# Patient Record
Sex: Male | Born: 1980 | Race: Black or African American | Hispanic: No | Marital: Single | Smoking: Never smoker
Health system: Southern US, Community
[De-identification: ages and names within clinical notes are randomized; demographics above are authoritative.]

## PROBLEM LIST (undated history)

## (undated) ENCOUNTER — Ambulatory Visit (HOSPITAL_COMMUNITY): Admission: EM | Payer: Self-pay | Source: Home / Self Care

---

## 2017-04-21 ENCOUNTER — Encounter (HOSPITAL_COMMUNITY): Payer: Self-pay | Admitting: Emergency Medicine

## 2017-04-21 ENCOUNTER — Ambulatory Visit (HOSPITAL_COMMUNITY)
Admission: EM | Admit: 2017-04-21 | Discharge: 2017-04-21 | Disposition: A | Discharge status: Home / Self Care | Payer: Self-pay | Attending: Family Medicine | Admitting: Family Medicine

## 2017-04-21 DIAGNOSIS — J0110 Acute frontal sinusitis, unspecified: Secondary | ICD-10-CM

## 2017-04-21 MED ORDER — AMOXICILLIN-POT CLAVULANATE 875-125 MG PO TABS: 1.0000 | ORAL_TABLET | Freq: Two times a day (BID) | ORAL | 0 refills | Status: AC

## 2017-04-21 NOTE — Discharge Instructions (Signed)
I'm treating you for acute sinusitis. I prescribed a medicine called Augmentin, take one tablet twice a day for 7 days. I also recommend Flonase, 2 sprays each nostril once daily. If your symptoms persist, or fail to resolve, return to clinic, or follow-up with primary care provider in one week.

## 2017-04-21 NOTE — ED Triage Notes (Signed)
PT reports sore throat, nasal pain, facial pain, and painful sneezing for 3 week.s

## 2017-04-21 NOTE — ED Provider Notes (Signed)
CSN: [id number]     Arrival date & time 04/21/17  1047 History   First MD Initiated Contact with Patient 04/21/17 1153     Chief Complaint  Patient presents with  . Sore Throat  . Facial Pain   (Consider location/radiation/quality/duration/timing/severity/associated sxs/prior Treatment) 36 year old male presents to clinic for evaluation of sinus pain and pressure ongoing for [redacted] weeks along with fatigue, sore throat, ear pressure, and swollen lymph nodes. Sinus pressure is limited to the right frontal sinuses, is clear discharge with some blood streaking. He denies any fever, loss of appetite, nausea, vomiting, diarrhea, or other symptoms. He has not tried any over-the-counter medicines, does not smoke, and denies history of allergies. No recent history of URI   The history is provided by the patient.    History reviewed. No pertinent past medical history. History reviewed. No pertinent surgical history. No family history on file. Social History  Substance Use Topics  . Smoking status: Never Smoker  . Smokeless tobacco: Never Used  . Alcohol use No    Review of Systems  Constitutional: Negative for appetite change, chills and fatigue.  HENT: Positive for congestion, ear pain, rhinorrhea, sinus pain and sinus pressure. Negative for ear discharge, facial swelling, sneezing, trouble swallowing and voice change.   Eyes: Negative.   Respiratory: Negative.   Cardiovascular: Negative.   Gastrointestinal: Negative.   Genitourinary: Negative.   Musculoskeletal: Negative.   Skin: Negative.   Neurological: Negative.     Allergies  Patient has no known allergies.  Home Medications   Prior to Admission medications   Medication Sig Start Date End Date Taking? Authorizing Provider  amoxicillin-clavulanate (AUGMENTIN) 875-125 MG tablet Take 1 tablet by mouth 2 (two) times daily. 04/21/17   Dorena Bodo, NP   Meds Ordered and Administered this Visit  Medications - No data to  display  BP 132/86 (BP Location: Left Arm)   Pulse 60   Temp 98.6 F (37 C) (Oral)   Resp 16   Ht  (1.854 m)   Wt 260 lb (117.9 kg)   SpO2 98%   BMI 34.30 kg/m  No data found.   Physical Exam  Constitutional: He is oriented to person, place, and time. He appears well-developed and well-nourished. No distress.  HENT:  Head: Normocephalic and atraumatic.  Right Ear: Tympanic membrane and external ear normal.  Left Ear: Tympanic membrane and external ear normal.  Nose: Mucosal edema and rhinorrhea present. Right sinus exhibits maxillary sinus tenderness. Right sinus exhibits no frontal sinus tenderness. Left sinus exhibits no maxillary sinus tenderness and no frontal sinus tenderness.  Eyes: Conjunctivae are normal. Right eye exhibits no discharge. Left eye exhibits no discharge.  Neck: Normal range of motion. Neck supple.  Cardiovascular: Normal rate and regular rhythm.   Pulmonary/Chest: Effort normal.  Lymphadenopathy:    He has no cervical adenopathy.  Neurological: He is alert and oriented to person, place, and time.  Skin: Skin is warm and dry. Capillary refill takes less than 2 seconds. He is not diaphoretic.  Psychiatric: He has a normal mood and affect. His behavior is normal.  Nursing note and vitals reviewed.   Urgent Care Course     Procedures (including critical care time)  Labs Review Labs Reviewed - No data to display  Imaging Review No results found.     MDM   1. Acute non-recurrent frontal sinusitis     Treating for acute sinusitis, counseling provided on over-the-counter therapies for symptom management. Recommend hydration, rest,  return to clinic in one week for follow-up with primary care symptoms persist.     Dorena Bodo, NP 04/21/17 1211

## 2018-02-03 ENCOUNTER — Other Ambulatory Visit: Payer: Self-pay

## 2018-02-03 ENCOUNTER — Emergency Department (HOSPITAL_COMMUNITY)
Admission: EM | Admit: 2018-02-03 | Discharge: 2018-02-03 | Disposition: A | Discharge status: Home / Self Care | Payer: Self-pay | Attending: Emergency Medicine | Admitting: Emergency Medicine

## 2018-02-03 ENCOUNTER — Encounter (HOSPITAL_COMMUNITY): Payer: Self-pay | Admitting: *Deleted

## 2018-02-03 ENCOUNTER — Emergency Department (HOSPITAL_COMMUNITY): Payer: Self-pay

## 2018-02-03 DIAGNOSIS — J111 Influenza due to unidentified influenza virus with other respiratory manifestations: Secondary | ICD-10-CM | POA: Insufficient documentation

## 2018-02-03 DIAGNOSIS — Z79899 Other long term (current) drug therapy: Secondary | ICD-10-CM | POA: Insufficient documentation

## 2018-02-03 DIAGNOSIS — R69 Illness, unspecified: Secondary | ICD-10-CM

## 2018-02-03 LAB — COMPREHENSIVE METABOLIC PANEL
ALBUMIN: 3.7 g/dL (ref 3.5–5.0)
ALK PHOS: 55 U/L (ref 38–126)
ALT: 30 U/L (ref 17–63)
ANION GAP: 14 (ref 5–15)
AST: 30 U/L (ref 15–41)
BILIRUBIN TOTAL: 0.5 mg/dL (ref 0.3–1.2)
BUN: 12 mg/dL (ref 6–20)
CALCIUM: 9.1 mg/dL (ref 8.9–10.3)
CO2: 25 mmol/L (ref 22–32)
Chloride: 98 mmol/L — ABNORMAL LOW (ref 101–111)
Creatinine, Ser: 1.2 mg/dL (ref 0.61–1.24)
GLUCOSE: 111 mg/dL — AB (ref 65–99)
POTASSIUM: 3.4 mmol/L — AB (ref 3.5–5.1)
Sodium: 137 mmol/L (ref 135–145)
TOTAL PROTEIN: 7.8 g/dL (ref 6.5–8.1)

## 2018-02-03 LAB — CBC WITH DIFFERENTIAL/PLATELET
BASOS PCT: 0 %
Basophils Absolute: 0 10*3/uL (ref 0.0–0.1)
Eosinophils Absolute: 0 10*3/uL (ref 0.0–0.7)
Eosinophils Relative: 0 %
HEMATOCRIT: 44.2 % (ref 39.0–52.0)
Hemoglobin: 14.8 g/dL (ref 13.0–17.0)
LYMPHS PCT: 34 %
Lymphs Abs: 2.5 10*3/uL (ref 0.7–4.0)
MCH: 29 pg (ref 26.0–34.0)
MCHC: 33.5 g/dL (ref 30.0–36.0)
MCV: 86.7 fL (ref 78.0–100.0)
MONO ABS: 1 10*3/uL (ref 0.1–1.0)
Monocytes Relative: 14 %
NEUTROS ABS: 3.9 10*3/uL (ref 1.7–7.7)
NEUTROS PCT: 52 %
Platelets: 318 10*3/uL (ref 150–400)
RBC: 5.1 MIL/uL (ref 4.22–5.81)
RDW: 13.7 % (ref 11.5–15.5)
WBC: 7.5 10*3/uL (ref 4.0–10.5)

## 2018-02-03 LAB — RAPID STREP SCREEN (MED CTR MEBANE ONLY): Streptococcus, Group A Screen (Direct): NEGATIVE

## 2018-02-03 MED ORDER — SODIUM CHLORIDE 0.9 % IV BOLUS (SEPSIS)
1000.0000 mL | Freq: Once | INTRAVENOUS | Status: AC
  Administered 2018-02-03: 1000 mL via INTRAVENOUS

## 2018-02-03 NOTE — Discharge Instructions (Signed)
Please take Ibuprofen (Advil, motrin) and Tylenol (acetaminophen) to relieve your pain.  You may take up to 600 MG (3 pills) of normal strength ibuprofen every 8 hours as needed.  In between doses of ibuprofen you make take tylenol, up to 1,000 mg (two extra strength pills).  Do not take more than 3,000 mg tylenol in a 24 hour period.  Please check all medication labels as many medications such as pain and cold medications may contain tylenol.  Do not drink alcohol while taking these medications.  Do not take other NSAID'S while taking ibuprofen (such as aleve or naproxen).  Please take ibuprofen with food to decrease stomach upset.  Today your electrolytes were normal.  Please make sure you stay well hydrated.  If you have any concerns or develop new symptoms then please seek additional medical evaluation.

## 2018-02-03 NOTE — ED Notes (Signed)
ED Provider at bedside. 

## 2018-02-03 NOTE — ED Triage Notes (Signed)
States Fri didn't feel well after eating drink some alcohol fri night had a handover on sat. States she has had fever and chills with "tremorms in his face". C/o generalized bodyaches not sleeping , states he has been taking otc flu medication last several days.

## 2018-02-03 NOTE — ED Provider Notes (Signed)
[facility] EMERGENCY DEPARTMENT Provider Note   CSN: [id number] Arrival date & time: 02/03/18  4098     History   Chief Complaint No chief complaint on file.   HPI Troy Beard is a 37 y.o. male who presents today for evaluation of multiple complaints. 1.  Fever, cough, and body ache: This has been ongoing for 4-5 days.  It has not been getting better or worse.  He has taken theraflu with out symptom relief.  His aches are diffuse through entire body with out any localization.  Nothing makes his pain better.  He has not checked his temperature at home but reports chills.  He reports recent sick contacts who have had the flu.  His cough is non productive.  He denies smoking tobacco.  He reports that he had a few drinks on Friday night and initially thought these symptoms were a hang over.    2. Rash: He reports that overnight he has developed multiple small bumps on his lips.  He reports that he has gotten cold sores in the past.  These bumps are nonpainful.  He denies any new exposures.  He denies any recent physical contact with another person.  He denies drainage from the bumps.  No alleviating or provoking factors noted.  3. Stress/not sleeping: He reports that since he started getting sick he has not been sleeping well.  When asked to clarify he reports that he has difficulty falling asleep and staying asleep, however he is able to sleep for up to 1 hour at a time.  He reports significant stress at work, including recent arguments with a coworker and that last night he was told his uncle died and he was up all night crying.    4. Twitching around right eye: He reports that after his poor sleep he noted that he was having twitching around his right eye.  This is intermittent, and has gone away.  He denies any facial droop or slurred speech during this time.  No eye pain.  He reports no other twitching.    HPI  History reviewed. No pertinent past medical  history.  There are no active problems to display for this patient.   History reviewed. No pertinent surgical history.     Home Medications    Prior to Admission medications   Medication Sig Start Date End Date Taking? Authorizing Provider  amoxicillin-clavulanate (AUGMENTIN) 875-125 MG tablet Take 1 tablet by mouth 2 (two) times daily. 04/21/17   Dorena Bodo, NP    Family History No family history on file.  Social History Social History   Tobacco Use  . Smoking status: Never Smoker  . Smokeless tobacco: Never Used  Substance Use Topics  . Alcohol use: Yes  . Drug use: Yes    Types: Marijuana     Allergies   Patient has no known allergies.   Review of Systems Review of Systems  Constitutional: Positive for chills and fatigue. Negative for fever.  HENT: Positive for sore throat. Negative for congestion, postnasal drip, rhinorrhea and sinus pain.   Respiratory: Positive for cough. Negative for shortness of breath.        Chest congestion  Cardiovascular: Negative for chest pain and palpitations.  Gastrointestinal: Negative for abdominal pain, diarrhea, nausea and vomiting.  Genitourinary: Negative for dysuria.  Musculoskeletal: Positive for arthralgias and myalgias. Negative for gait problem, joint swelling, neck pain and neck stiffness.  Skin: Positive for rash (Skin colored bumps around lips).  Neurological:  Negative for headaches.       Intermittent right eye twitching  Psychiatric/Behavioral: Positive for sleep disturbance (Difficulty getting comfortable). Negative for confusion. The patient is nervous/anxious.   All other systems reviewed and are negative.    Physical Exam Updated Vital Signs BP 113/69   Pulse 79   Temp 99.1 F (37.3 C)   Resp 18   Ht 6' 1.75" (1.873 m)   Wt 114.3 kg (252 lb)   SpO2 96%   BMI 32.57 kg/m   Physical Exam  Constitutional: He is oriented to person, place, and time. He appears well-developed and well-nourished.   HENT:  Head: Normocephalic and atraumatic.  Right Ear: External ear normal.  Left Ear: External ear normal.  Mouth/Throat: Mucous membranes are normal. No trismus in the jaw. No uvula swelling or lacerations. Posterior oropharyngeal erythema present. No oropharyngeal exudate or posterior oropharyngeal edema. Tonsils are 2+ on the right. Tonsils are 2+ on the left. No tonsillar exudate.  Eyes: Conjunctivae and EOM are normal. Pupils are equal, round, and reactive to light. Right eye exhibits no discharge. Left eye exhibits no discharge.  No obvious twitching to face or bilateral eyes  Neck: Trachea normal, normal range of motion, full passive range of motion without pain and phonation normal. Neck supple.  Cardiovascular: Normal rate, regular rhythm, normal heart sounds and intact distal pulses.  No murmur heard. Pulmonary/Chest: Effort normal and breath sounds normal. No respiratory distress. He has no rales. He exhibits no tenderness.  Abdominal: Soft. Bowel sounds are normal. He exhibits no distension. There is no tenderness (When palpating abdomen he reports it feels good). There is no guarding.  Musculoskeletal: He exhibits no edema, tenderness or deformity.  Lymphadenopathy:    He has no cervical adenopathy.  Neurological: He is alert and oriented to person, place, and time.  Skin: Skin is warm and dry. He is not diaphoretic.  Psychiatric: His speech is normal. His mood appears anxious.  Patient is very anxious.  He is attentive.  Nursing note and vitals reviewed.    ED Treatments / Results  Labs (all labs ordered are listed, but only abnormal results are displayed) Labs Reviewed  COMPREHENSIVE METABOLIC PANEL - Abnormal; Notable for the following components:      Result Value   Potassium 3.4 (*)    Chloride 98 (*)    Glucose, Bld 111 (*)    All other components within normal limits  RAPID STREP SCREEN (NOT [facility])  CULTURE, GROUP A STREP ([facility])  CBC WITH  DIFFERENTIAL/PLATELET    EKG  EKG Interpretation None       Radiology Dg Chest 2 View  Result Date: 02/03/2018 CLINICAL DATA:  Feeling unwell after drinking alcohol, persistent hangover with fever and chills. Body aches. EXAM: CHEST  2 VIEW COMPARISON:  None. FINDINGS: Cardiomediastinal silhouette is normal. No pleural effusions or focal consolidations. Trachea projects midline and there is no pneumothorax. Soft tissue planes and included osseous structures are non-suspicious. IMPRESSION: Normal chest. Electronically Signed   By: Awilda Metroourtnay  Bloomer M.D.   On: 02/03/2018 06:08    Procedures Procedures (including critical care time)  Medications Ordered in ED Medications  sodium chloride 0.9 % bolus 1,000 mL (0 mLs Intravenous Stopped 02/03/18 1054)     Initial Impression / Assessment and Plan / ED Course  I have reviewed the triage vital signs and the nursing notes.  Pertinent labs & imaging results that were available during my care of the patient were reviewed by me  and considered in my medical decision making (see chart for details).    Patient with symptoms consistent with influenza.  Vitals are stable, low-grade fever.  No signs of dehydration, tolerating PO's.  Lungs are clear.CXR obtained with out acute abnormalities.  Discussed the cost versus benefit of Tamiflu treatment with the patient.  The patient understands that symptoms are greater than the recommended 24-48 hour window of treatment.  Patient will be discharged with instructions to orally hydrate, rest, and use over-the-counter medications such as anti-inflammatories ibuprofen and Aleve for muscle aches and Tylenol for fever.  Regarding the bumps on his lip, these are nonspecific, not obvious consistent with cold sores, as they are nonpainful nontender to palpation.  Recommended PCP follow-up.  He was given resources on dealing with stress and anxiety.  I suspect that his eye twitching is idiopathic, secondary to stress.  He  is neurovascularly intact without evidence of eye twitching here.  He was given return precautions, and states his understanding.  He was given instructions to orally rehydrate.  Fluid bolus given while in ED which improved his symptoms.  He was given instructions on appropriate sleep hygiene.  He is able to sleep, however his sleep is disrupted frequently, I suspect that this is secondary to stress and influenza-like illness.  He was instructed to take over-the-counter medicines, follow good sleep hygiene practices, and follow-up with primary care provider.  I do not suspect meningitis as he is afebrile here, not tachycardic, not tachypneic, has had symptoms for accidentally 4 days, and does not have any nuchal rigidity.  Given that he did not pass out or lose consciousness I do not suspect that this is seizure-like activity.   At this time there does not appear to be any evidence of an acute emergency medical condition and the patient appears stable for discharge with appropriate outpatient follow up.Diagnosis was discussed with patient who verbalizes understanding and is agreeable to discharge.   Final Clinical Impressions(s) / ED Diagnoses   Final diagnoses:  Influenza-like illness    ED Discharge Orders    None       Norman Clay 02/03/18 1709    Maia Plan, MD 02/03/18 [phone]

## 2018-02-05 LAB — CULTURE, GROUP A STREP (THRC)

## 2018-10-28 IMAGING — DX DG CHEST 2V
2 series · 2 of 2 positions shown · non-contrast
Comparison: None.

CLINICAL DATA: Feeling unwell after drinking alcohol, persistent
hangover with fever and chills. Body aches.

EXAM:
CHEST  2 VIEW

[chest pa]
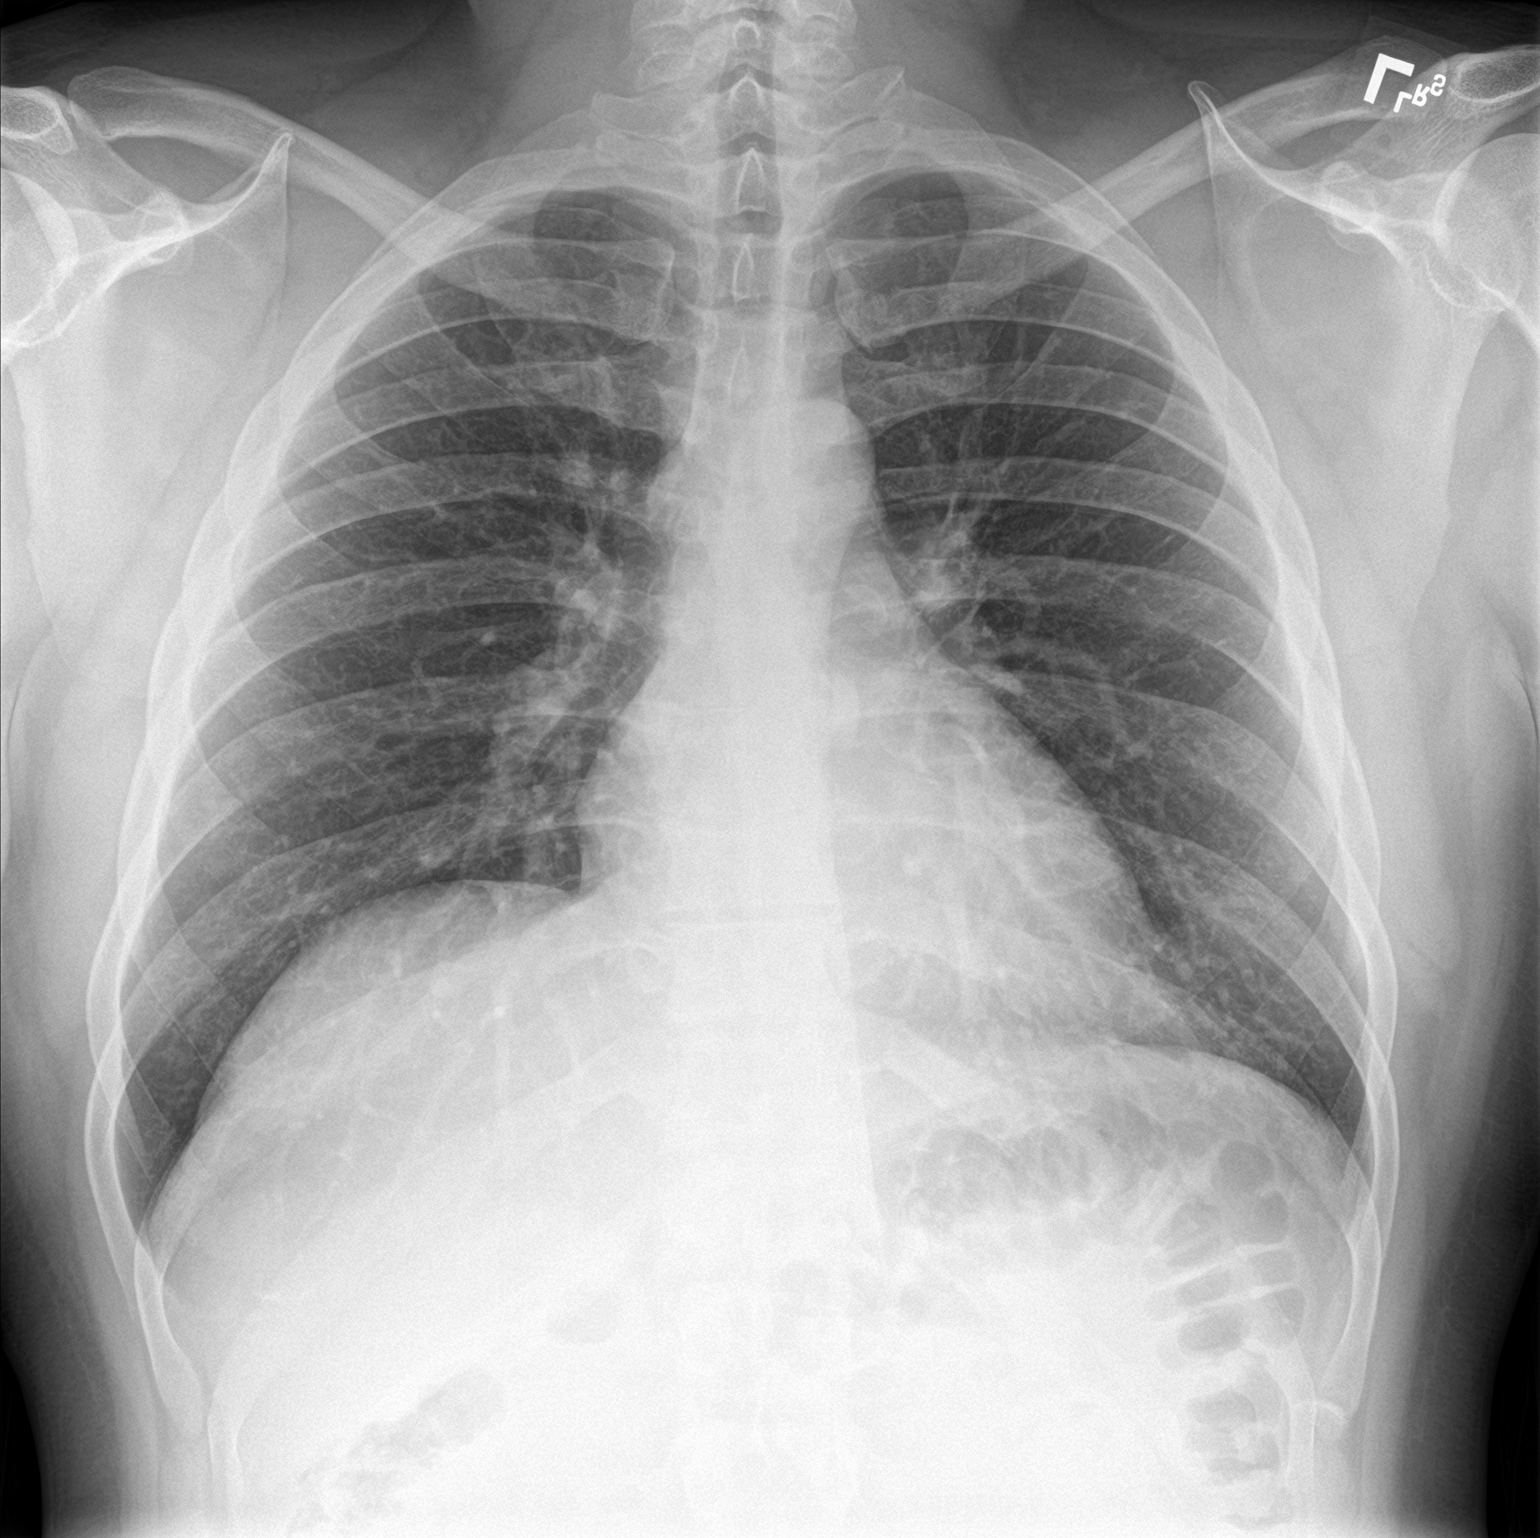

[chest lat]
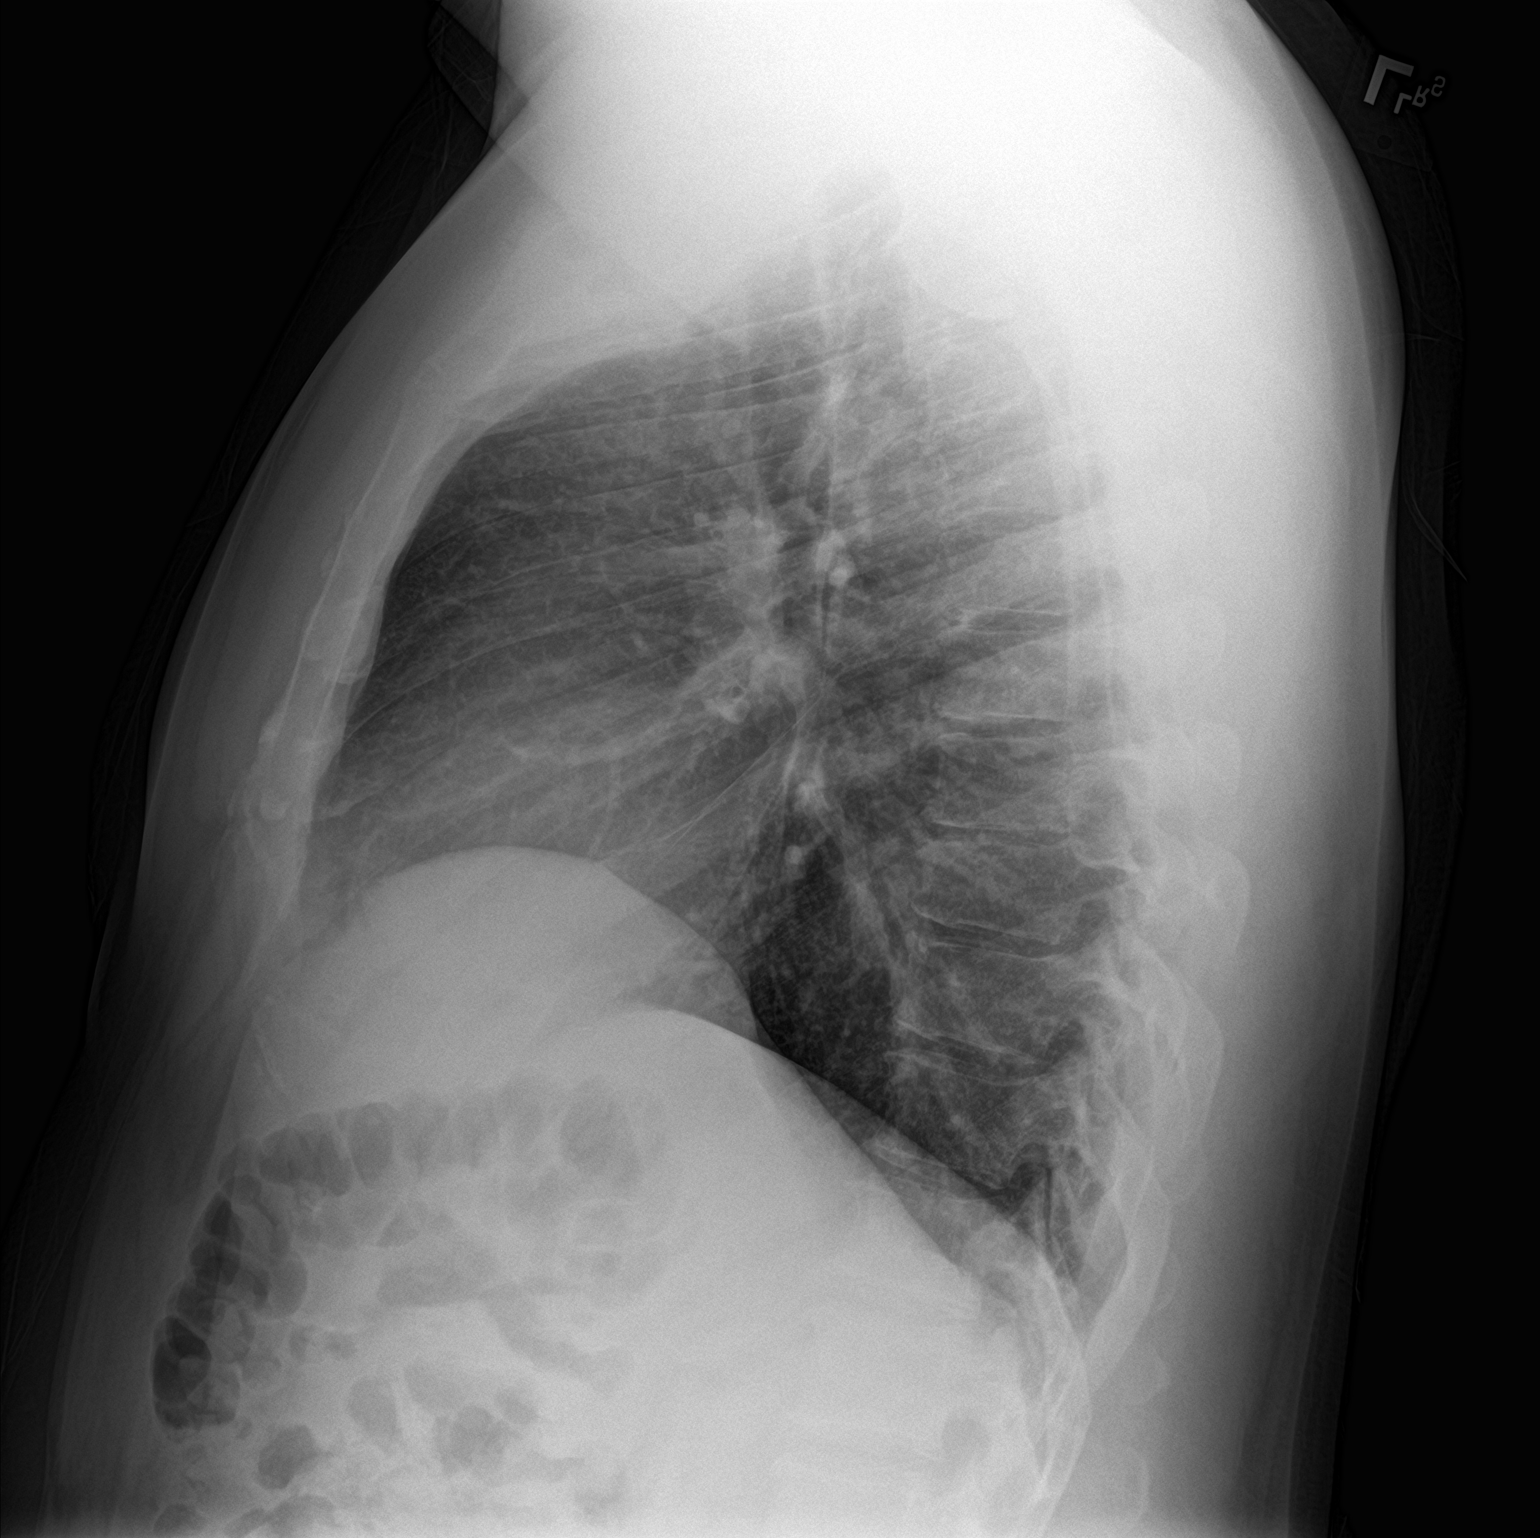

[2 of 2 positions shown; findings below may reference images not displayed]

FINDINGS: Cardiomediastinal silhouette is normal. No pleural effusions or
focal consolidations. Trachea projects midline and there is no
pneumothorax. Soft tissue planes and included osseous structures are
non-suspicious.
IMPRESSION: Normal chest.

## 2021-12-07 ENCOUNTER — Other Ambulatory Visit: Payer: Self-pay
